# Patient Record
Sex: Female | Born: 1990 | Race: White | Marital: Single | Smoking: Never smoker
Health system: Southern US, Community
[De-identification: ages and names within clinical notes are randomized; demographics above are authoritative.]

## PROBLEM LIST (undated history)

## (undated) DIAGNOSIS — E162 Hypoglycemia, unspecified: Secondary | ICD-10-CM

---

## 2014-08-21 ENCOUNTER — Other Ambulatory Visit: Payer: Self-pay | Admitting: Orthopedic Surgery

## 2014-08-21 DIAGNOSIS — M25531 Pain in right wrist: Secondary | ICD-10-CM

## 2014-08-28 ENCOUNTER — Ambulatory Visit
Admission: RE | Admit: 2014-08-28 | Discharge: 2014-08-28 | Disposition: A | Discharge status: Home / Self Care | Payer: BLUE CROSS/BLUE SHIELD | Source: Ambulatory Visit | Attending: Orthopedic Surgery | Admitting: Orthopedic Surgery

## 2014-08-28 DIAGNOSIS — M25531 Pain in right wrist: Secondary | ICD-10-CM

## 2014-11-10 ENCOUNTER — Emergency Department (HOSPITAL_COMMUNITY): Payer: BLUE CROSS/BLUE SHIELD

## 2014-11-10 ENCOUNTER — Encounter (HOSPITAL_COMMUNITY): Payer: Self-pay | Admitting: Emergency Medicine

## 2014-11-10 ENCOUNTER — Emergency Department (HOSPITAL_COMMUNITY)
Admission: EM | Admit: 2014-11-10 | Discharge: 2014-11-10 | Disposition: A | Discharge status: Home / Self Care | Payer: BLUE CROSS/BLUE SHIELD | Attending: Emergency Medicine | Admitting: Emergency Medicine

## 2014-11-10 DIAGNOSIS — W500XXA Accidental hit or strike by another person, initial encounter: Secondary | ICD-10-CM | POA: Diagnosis not present

## 2014-11-10 DIAGNOSIS — Y998 Other external cause status: Secondary | ICD-10-CM | POA: Diagnosis not present

## 2014-11-10 DIAGNOSIS — S4991XA Unspecified injury of right shoulder and upper arm, initial encounter: Secondary | ICD-10-CM | POA: Diagnosis not present

## 2014-11-10 DIAGNOSIS — Z8639 Personal history of other endocrine, nutritional and metabolic disease: Secondary | ICD-10-CM | POA: Diagnosis not present

## 2014-11-10 DIAGNOSIS — Y92838 Other recreation area as the place of occurrence of the external cause: Secondary | ICD-10-CM | POA: Diagnosis not present

## 2014-11-10 DIAGNOSIS — Y9364 Activity, baseball: Secondary | ICD-10-CM | POA: Diagnosis not present

## 2014-11-10 HISTORY — DX: Hypoglycemia, unspecified: E16.2

## 2014-11-10 NOTE — ED Notes (Addendum)
Pt states she dove to ground during softball today, while other player tagged her arm, pt states he shoulder dislocated. Pt states her trainer reduced her shoulder on site. Pulses, sensation, and motor function intact.

## 2014-11-10 NOTE — ED Provider Notes (Signed)
CSN: [id number]     Arrival date & time 11/10/14  1748 History  This chart was scribed for non-physician practitioner, Oswaldo ConroyVictoria Kalli Greenfield, PA-C,working with Lorre NickAnthony Allen, MD, by Karle PlumberJennifer Tensley, ED Scribe. This patient was seen in room WTR7/WTR7 and the patient's care was started at 6:58 PM.  No chief complaint on file.  The history is provided by the patient and medical records. No language interpreter was used.    HPI Comments:  Jill Erickson is a 24 y.o. female who presents to the Emergency Department complaining of a right shoulder injury that occurred approximately 1.5 hours ago. Pt reports she was playing softball, dove into the ground and dislocated her shoulder. She states the team trainer reduced the shoulder immediately. She reports minimal aching of the right shoulder. Denies modifying factors. She has not taken anything to treat the pain. Denies numbness, tingling, weakness or bruising. She reports that she has had surgery to this shoulder in the past to repair a torn labrum.   Past Medical History  Diagnosis Date  . Hypoglycemia    History reviewed. No pertinent past surgical history. History reviewed. No pertinent family history. History  Substance Use Topics  . Smoking status: Never Smoker   . Smokeless tobacco: Not on file  . Alcohol Use: No   OB History    No data available     Review of Systems  Musculoskeletal: Positive for arthralgias. Negative for joint swelling.  Skin: Negative for color change and wound.  Neurological: Negative for weakness and numbness.    Allergies  Review of patient's allergies indicates no known allergies.  Home Medications   Prior to Admission medications   Medication Sig Start Date End Date Taking? Authorizing Provider  ibuprofen (ADVIL,MOTRIN) 200 MG tablet Take 400 mg by mouth every 6 (six) hours as needed for moderate pain (back pain).   Yes Historical Provider, MD  norethindrone-ethinyl estradiol (MICROGESTIN,JUNEL,LOESTRIN) 1-20  MG-MCG tablet Take 1 tablet by mouth at bedtime.   Yes Historical Provider, MD   Triage Vitals: BP 139/86 mmHg  Pulse 53  Temp(Src) 98.5 F (36.9 C) (Oral)  Resp 16  SpO2 99%  LMP 11/07/2014 Physical Exam  Constitutional: She appears well-developed and well-nourished. No distress.  HENT:  Head: Normocephalic and atraumatic.  Eyes: Conjunctivae are normal. Right eye exhibits no discharge. Left eye exhibits no discharge.  Cardiovascular:  2+ radial pulses equal bilaterally  Pulmonary/Chest: Effort normal. No respiratory distress.  Musculoskeletal:  No clavicular step of or crepitus No right shoulder deformity Full ROM of right shoulder without pain  Neurological: She is alert. Coordination normal.  Good strength of RUE Distal sensations intact  Skin: She is not diaphoretic.  No skin changes  Psychiatric: She has a normal mood and affect. Her behavior is normal.  Nursing note and vitals reviewed.   ED Course  Procedures (including critical care time) DIAGNOSTIC STUDIES: Oxygen Saturation is 99% on RA, normal by my interpretation.   COORDINATION OF CARE: 7:05 PM- Offered pain medication but pt declined stating she has Tylenol/Ibuprofen at home. Advised pt to follow up with orthopedist for continued or ongoing pain. Pt verbalizes understanding and agrees to plan.  Medications - No data to display  Labs Review Labs Reviewed - No data to display  Imaging Review Dg Shoulder Right  11/10/2014   CLINICAL DATA:  Right shoulder pain, softball injury  EXAM: RIGHT SHOULDER - 2+ VIEW  COMPARISON:  None.  FINDINGS: Two views of the right shoulder submitted. No acute fracture  or subluxation.  IMPRESSION: Negative.   Electronically Signed   By: Natasha Mead M.D.   On: 11/10/2014 18:45     EKG Interpretation None      MDM   Final diagnoses:  Right shoulder injury, initial encounter   Patient presenting with report of shoulder dislocation. Neurovascularly intact. X-ray without  acute fracture or subluxation. Patient in a sling and may continue use for comfort. Rice and ibuprofen use discussed. Patient encouraged to follow-up with orthopedics for persistent symptoms.  Discussed return precautions with patient. Discussed all results and patient verbalizes understanding and agrees with plan.  I personally performed the services described in this documentation, which was scribed in my presence. The recorded information has been reviewed and is accurate.    Oswaldo Conroy, PA-C 11/10/14 1920  Lorre Nick, MD 11/12/14 [phone]

## 2014-11-10 NOTE — Discharge Instructions (Signed)
Return to the emergency room with worsening of symptoms, new symptoms or with symptoms that are concerning, especially fevers, redness, swelling, numbness, tingling, weakness. RICE: Rest, Ice (three cycles of 20 mins on, 20mins off at least twice a day), compression/brace, elevation. Heating pad works well for back pain. Ibuprofen 400mg  (2 tablets 200mg ) every 5-6 hours for 3-5 days. Follow up with PCP/orthopedist if symptoms worsen or are persistent. Read below information and follow recommendations. Shoulder Sprain A shoulder sprain is the result of damage to the tough, fiber-like tissues (ligaments) that help hold your shoulder in place. The ligaments may be stretched or torn. Besides the main shoulder joint (the ball and socket), there are several smaller joints that connect the bones in this area. A sprain usually involves one of those joints. Most often it is the acromioclavicular (or AC) joint. That is the joint that connects the collarbone (clavicle) and the shoulder blade (scapula) at the top point of the shoulder blade (acromion). A shoulder sprain is a mild form of what is called a shoulder separation. Recovering from a shoulder sprain may take some time. For some, pain lingers for several months. Most people recover without long term problems. CAUSES   A shoulder sprain is usually caused by some kind of trauma. This might be:  Falling on an outstretched arm.  Being hit hard on the shoulder.  Twisting the arm.  Shoulder sprains are more likely to occur in people who:  Play sports.  Have balance or coordination problems. SYMPTOMS   Pain when you move your shoulder.  Limited ability to move the shoulder.  Swelling and tenderness on top of the shoulder.  Redness or warmth in the shoulder.  Bruising.  A change in the shape of the shoulder. DIAGNOSIS  Your healthcare provider may:  Ask about your symptoms.  Ask about recent activity that might have caused those  symptoms.  Examine your shoulder. You may be asked to do simple exercises to test movement. The other shoulder will be examined for comparison.  Order some tests that provide a look inside the body. They can show the extent of the injury. The tests could include:  X-rays.  CT (computed tomography) scan.  MRI (magnetic resonance imaging) scan. RISKS AND COMPLICATIONS  Loss of full shoulder motion.  Ongoing shoulder pain. TREATMENT  How long it takes to recover from a shoulder sprain depends on how severe it was. Treatment options may include:  Rest. You should not use the arm or shoulder until it heals.  Ice. For 2 or 3 days after the injury, put an ice pack on the shoulder up to 4 times a day. It should stay on for 15 to 20 minutes each time. Wrap the ice in a towel so it does not touch your skin.  Over-the-counter medicine to relieve pain.  A sling or brace. This will keep the arm still while the shoulder is healing.  Physical therapy or rehabilitation exercises. These will help you regain strength and motion. Ask your healthcare provider when it is OK to begin these exercises.  Surgery. The need for surgery is rare with a sprained shoulder, but some people may need surgery to keep the joint in place and reduce pain. HOME CARE INSTRUCTIONS   Ask your healthcare provider about what you should and should not do while your shoulder heals.  Make sure you know how to apply ice to the correct area of your shoulder.  Talk with your healthcare provider about which medications should  be used for pain and swelling.  If rehabilitation therapy will be needed, ask your healthcare provider to refer you to a therapist. If it is not recommended, then ask about at-home exercises. Find out when exercise should begin. SEEK MEDICAL CARE IF:  Your pain, swelling, or redness at the joint increases. SEEK IMMEDIATE MEDICAL CARE IF:   You have a fever.  You cannot move your arm or  shoulder. Document Released: 12/06/2008 Document Revised: 10/12/2011 Document Reviewed: 12/06/2008 [facility] Patient Information 2015 [employer], [place]. This information is not intended to replace advice given to you by your health care provider. Make sure you discuss any questions you have with your health care provider.
